# Patient Record
Sex: Female | Born: 1975 | Hispanic: No | Smoking: Never smoker
Health system: Southern US, Community
[De-identification: ages and names within clinical notes are randomized; demographics above are authoritative.]

---

## 2005-10-30 ENCOUNTER — Ambulatory Visit (HOSPITAL_COMMUNITY): Admission: RE | Admit: 2005-10-30 | Discharge: 2005-10-30 | Payer: Self-pay | Admitting: Orthopedic Surgery

## 2005-11-04 ENCOUNTER — Other Ambulatory Visit: Admission: RE | Admit: 2005-11-04 | Discharge: 2005-11-04 | Payer: Self-pay | Admitting: *Deleted

## 2006-11-16 ENCOUNTER — Other Ambulatory Visit: Admission: RE | Admit: 2006-11-16 | Discharge: 2006-11-16 | Payer: Self-pay | Admitting: Family Medicine

## 2007-10-31 ENCOUNTER — Other Ambulatory Visit: Admission: RE | Admit: 2007-10-31 | Discharge: 2007-10-31 | Payer: Self-pay | Admitting: Family Medicine

## 2008-11-13 ENCOUNTER — Other Ambulatory Visit: Admission: RE | Admit: 2008-11-13 | Discharge: 2008-11-13 | Payer: Self-pay | Admitting: Family Medicine

## 2009-11-14 ENCOUNTER — Other Ambulatory Visit: Admission: RE | Admit: 2009-11-14 | Discharge: 2009-11-14 | Payer: Self-pay | Admitting: Family Medicine

## 2009-12-09 ENCOUNTER — Ambulatory Visit: Payer: Self-pay | Admitting: Gynecology

## 2010-12-03 ENCOUNTER — Other Ambulatory Visit (HOSPITAL_COMMUNITY)
Admission: RE | Admit: 2010-12-03 | Discharge: 2010-12-03 | Disposition: A | Discharge status: Home / Self Care | Payer: Managed Care, Other (non HMO) | Source: Ambulatory Visit | Attending: Family Medicine | Admitting: Family Medicine

## 2010-12-03 ENCOUNTER — Other Ambulatory Visit: Payer: Self-pay | Admitting: Family Medicine

## 2010-12-03 DIAGNOSIS — Z1159 Encounter for screening for other viral diseases: Secondary | ICD-10-CM | POA: Insufficient documentation

## 2010-12-03 DIAGNOSIS — Z124 Encounter for screening for malignant neoplasm of cervix: Secondary | ICD-10-CM | POA: Insufficient documentation

## 2011-10-22 ENCOUNTER — Other Ambulatory Visit: Payer: Self-pay | Admitting: Family Medicine

## 2011-10-22 ENCOUNTER — Other Ambulatory Visit (HOSPITAL_COMMUNITY)
Admission: RE | Admit: 2011-10-22 | Discharge: 2011-10-22 | Disposition: A | Discharge status: Home / Self Care | Payer: BC Managed Care – PPO | Source: Ambulatory Visit | Attending: Family Medicine | Admitting: Family Medicine

## 2011-10-22 DIAGNOSIS — Z124 Encounter for screening for malignant neoplasm of cervix: Secondary | ICD-10-CM | POA: Insufficient documentation

## 2011-10-22 DIAGNOSIS — Z1159 Encounter for screening for other viral diseases: Secondary | ICD-10-CM | POA: Insufficient documentation

## 2012-12-08 ENCOUNTER — Other Ambulatory Visit: Payer: Self-pay | Admitting: Physician Assistant

## 2012-12-08 ENCOUNTER — Other Ambulatory Visit (HOSPITAL_COMMUNITY)
Admission: RE | Admit: 2012-12-08 | Discharge: 2012-12-08 | Disposition: A | Discharge status: Home / Self Care | Payer: BC Managed Care – PPO | Source: Ambulatory Visit | Attending: Physician Assistant | Admitting: Physician Assistant

## 2012-12-08 DIAGNOSIS — Z01419 Encounter for gynecological examination (general) (routine) without abnormal findings: Secondary | ICD-10-CM | POA: Insufficient documentation

## 2012-12-08 DIAGNOSIS — Z1151 Encounter for screening for human papillomavirus (HPV): Secondary | ICD-10-CM | POA: Insufficient documentation

## 2015-06-05 ENCOUNTER — Other Ambulatory Visit: Payer: Self-pay | Admitting: Family Medicine

## 2015-06-05 ENCOUNTER — Other Ambulatory Visit (HOSPITAL_COMMUNITY)
Admission: RE | Admit: 2015-06-05 | Discharge: 2015-06-05 | Disposition: A | Discharge status: Home / Self Care | Payer: BLUE CROSS/BLUE SHIELD | Source: Ambulatory Visit | Attending: Family Medicine | Admitting: Family Medicine

## 2015-06-05 DIAGNOSIS — Z124 Encounter for screening for malignant neoplasm of cervix: Secondary | ICD-10-CM | POA: Diagnosis present

## 2015-06-05 DIAGNOSIS — Z1151 Encounter for screening for human papillomavirus (HPV): Secondary | ICD-10-CM | POA: Insufficient documentation

## 2015-06-06 LAB — CYTOLOGY - PAP

## 2016-02-12 ENCOUNTER — Encounter: Payer: Self-pay | Admitting: Podiatry

## 2016-02-12 ENCOUNTER — Ambulatory Visit (INDEPENDENT_AMBULATORY_CARE_PROVIDER_SITE_OTHER): Payer: BLUE CROSS/BLUE SHIELD

## 2016-02-12 ENCOUNTER — Ambulatory Visit (INDEPENDENT_AMBULATORY_CARE_PROVIDER_SITE_OTHER): Payer: BLUE CROSS/BLUE SHIELD | Admitting: Podiatry

## 2016-02-12 VITALS — BP 122/75 | HR 78 | Resp 16 | Ht 61.0 in | Wt 111.0 lb

## 2016-02-12 DIAGNOSIS — M79671 Pain in right foot: Secondary | ICD-10-CM

## 2016-02-12 DIAGNOSIS — M722 Plantar fascial fibromatosis: Secondary | ICD-10-CM | POA: Diagnosis not present

## 2016-02-12 MED ORDER — TRIAMCINOLONE ACETONIDE 10 MG/ML IJ SUSP
10.0000 mg | Freq: Once | INTRAMUSCULAR | Status: AC
  Administered 2016-02-12: 10 mg

## 2016-02-12 NOTE — Progress Notes (Signed)
   Subjective:    Patient ID: Sarah Ross, female    DOB: 04-Feb-1976, 40 y.o.   MRN: [id number]  HPI  Chief Complaint  Patient presents with  . Foot Pain    Right foot; arch and back of heel x 4-6 mo.         Review of Systems  All other systems reviewed and are negative.      Objective:   Physical Exam        Assessment & Plan:

## 2016-02-12 NOTE — Patient Instructions (Signed)

## 2016-02-13 NOTE — Progress Notes (Signed)
Subjective:     Patient ID: Sarah Ross Ismael, female   DOB: 1975/09/25, 40 y.o.   MRN: [id number]  HPI patient presents stating she's having a lot of pain in the bottom of her right heel of several months duration. Does not remember specific injury but she is very active and does a lot of exercising also   Review of Systems  All other systems reviewed and are negative.      Objective:   Physical Exam  Constitutional: She is oriented to person, place, and time.  Cardiovascular: Intact distal pulses.   Musculoskeletal: Normal range of motion.  Neurological: She is oriented to person, place, and time.  Skin: Skin is warm and dry.  Nursing note and vitals reviewed.  neurovascular status intact muscle strength adequate range of motion within normal limits with patient found to have exquisite discomfort plantar aspect right heel at the insertional point tendon the calcaneus with fluid buildup around the medial band. Moderate depression of the arch and a very small foot is noted     Assessment:     Inflammatory fasciitis plantar heel with small foot structure and narrow foot structure    Plan:     H&P x-rays reviewed and carefully injected the plantar fascia 3 mg Kenalog 5 mill grams Xylocaine and applied fascial brace. Instructed on reduced activity and reappoint to recheck  X-ray report indicates that there is small spur with no indication stress fracture arthritis

## 2016-02-26 ENCOUNTER — Encounter: Payer: Self-pay | Admitting: Podiatry

## 2016-02-26 ENCOUNTER — Ambulatory Visit (INDEPENDENT_AMBULATORY_CARE_PROVIDER_SITE_OTHER): Payer: BLUE CROSS/BLUE SHIELD | Admitting: Podiatry

## 2016-02-26 DIAGNOSIS — M722 Plantar fascial fibromatosis: Secondary | ICD-10-CM

## 2016-02-27 NOTE — Progress Notes (Signed)
Subjective:     Patient ID: Sarah Ross, female   DOB: 11-08-1975, 40 y.o.   MRN: [id number]  HPI patient presents stating that her heel is feeling some better and she needs more support and the brace has helped some   Review of Systems     Objective:   Physical Exam Neurovascular status unchanged with depression of the arch and moderate discomfort plantar fascia right and left    Assessment:     Plantar fasciitis    Plan:     Advised on physical therapy anti-inflammatories and scanned for custom orthotics to reduce plantar pressure on the feet. Also dispensed night splint with all instructions on usage

## 2016-03-18 ENCOUNTER — Ambulatory Visit (INDEPENDENT_AMBULATORY_CARE_PROVIDER_SITE_OTHER): Payer: BLUE CROSS/BLUE SHIELD | Admitting: Podiatry

## 2016-03-18 ENCOUNTER — Encounter: Payer: Self-pay | Admitting: Podiatry

## 2016-03-18 DIAGNOSIS — M722 Plantar fascial fibromatosis: Secondary | ICD-10-CM | POA: Diagnosis not present

## 2016-03-18 NOTE — Patient Instructions (Signed)

## 2016-03-19 NOTE — Progress Notes (Signed)
Subjective:     Patient ID: Sarah Ross, female   DOB: [birth year], 40 y.o.   MRN: [id number]  HPI patient states my foot feeling much better with mild discomfort with exertion   Review of Systems     Objective:   Physical Exam Neurovascular status intact with discomfort that's diminished on the plantar right    Assessment:     Doing well with plantar fasciitis    Plan:     Orthotics dispensed with instructions on usage and continue anti-inflammatories supportive shoes the current time

## 2016-06-19 ENCOUNTER — Ambulatory Visit: Payer: BLUE CROSS/BLUE SHIELD | Admitting: Podiatry

## 2016-07-01 ENCOUNTER — Ambulatory Visit (INDEPENDENT_AMBULATORY_CARE_PROVIDER_SITE_OTHER): Payer: BLUE CROSS/BLUE SHIELD | Admitting: Podiatry

## 2016-07-01 DIAGNOSIS — M722 Plantar fascial fibromatosis: Secondary | ICD-10-CM

## 2016-07-01 DIAGNOSIS — M79671 Pain in right foot: Secondary | ICD-10-CM

## 2016-07-01 MED ORDER — NONFORMULARY OR COMPOUNDED ITEM: 1.0000 g | Freq: Four times a day (QID) | 2 refills | Status: AC

## 2016-07-05 NOTE — Progress Notes (Signed)
Subjective: Patient presents today for evaluation of right heel pain. Patient was last seen 03/18/2016 under the care of Dr. Charlsie Merlesregal 4 plantar fasciitis to the right lower extremity. Patient states that it has flared up again. Patient denies trauma or any change in routine activity.  Objective: Physical Exam General: The patient is alert and oriented x3 in no acute distress.  Dermatology: Skin is warm, dry and supple bilateral lower extremities. Negative for open lesions or macerations bilateral.   Vascular: Dorsalis Pedis and Posterior Tibial pulses palpable bilateral.  Capillary fill time is immediate to all digits.  Neurological: Epicritic and protective threshold intact bilateral.   Musculoskeletal: Tenderness to palpation at the medial calcaneal tubercale and through the insertion of the plantar fascia of the right foot. All other joints range of motion within normal limits bilateral. Strength 5/5 in all groups bilateral.   Assessment: 1. Plantar fasciitis right 2. Pain in right foot  Plan of Care:  1. Patient evaluated.   2. Plantar fascial band dispensed. 3. Informed the patient that she needs to continue with conservative modalities including stretching 4. Prescription for anti-inflammatory pain cream dispensed [facility] 5. Recommend Vionic shoes and sandals 6. Return to clinic in 4 weeks 7. Patient did not want an anti-inflammatory injection today  Felecia ShellingBrent M. Evans, DPM [facility]  Dr. Felecia ShellingBrent M. Evans, DPM    [address]                                        [place], [place] [address]                Office [phone]  Fax [phone](336) [phone]

## 2016-07-29 ENCOUNTER — Ambulatory Visit: Payer: BLUE CROSS/BLUE SHIELD | Admitting: Podiatry

## 2016-08-03 ENCOUNTER — Ambulatory Visit (INDEPENDENT_AMBULATORY_CARE_PROVIDER_SITE_OTHER): Payer: BLUE CROSS/BLUE SHIELD | Admitting: Podiatry

## 2016-08-03 DIAGNOSIS — M79671 Pain in right foot: Secondary | ICD-10-CM | POA: Diagnosis not present

## 2016-08-03 DIAGNOSIS — M722 Plantar fascial fibromatosis: Secondary | ICD-10-CM | POA: Diagnosis not present

## 2016-08-03 NOTE — Progress Notes (Signed)
Subjective: Patient presents today for follow-up treatment and evaluation of plantar fasciitis to the right foot. Patient continues to feel pain however she does feel a little better. Patient presents today with her custom molded insoles and over-the-counter insoles from [facility].   Objective: Physical Exam General: The patient is alert and oriented x3 in no acute distress.  Dermatology: Skin is warm, dry and supple bilateral lower extremities. Negative for open lesions or macerations bilateral.   Vascular: Dorsalis Pedis and Posterior Tibial pulses palpable bilateral.  Capillary fill time is immediate to all digits.  Neurological: Epicritic and protective threshold intact bilateral.   Musculoskeletal: Tenderness to palpation at the medial calcaneal tubercale and through the insertion of the plantar fascia of the right foot. All other joints range of motion within normal limits bilateral. Strength 5/5 in all groups bilateral.   Assessment: 1. Plantar fasciitis right 2. Pain in right foot  Plan of Care:  1. Patient evaluated.   2. Recommend the patient wears her custom molded insoles instead of the over-the-counter insoles, as the custom molded insoles contour her foot structure better 3. Injection of 0.5 mL Celestone Soluspan injected in the patient's right plantar fascia at the medial calcaneal tubercle 4. Continue conservative modalities including stretching, icing, appropriate shoe gear with insoles 5. Return to clinic in 4 weeks  Felecia ShellingBrent M. Breylin Dom, DPM [facility]  Dr. Felecia ShellingBrent M. Braylon Lemmons, DPM    [address]                                        [place], [place] [address]                Office [phone]) [phone]  Fax [phone](336) [phone]

## 2016-08-31 ENCOUNTER — Ambulatory Visit: Payer: BLUE CROSS/BLUE SHIELD | Admitting: Podiatry

## 2016-09-14 ENCOUNTER — Ambulatory Visit (INDEPENDENT_AMBULATORY_CARE_PROVIDER_SITE_OTHER): Payer: BLUE CROSS/BLUE SHIELD | Admitting: Podiatry

## 2016-09-14 ENCOUNTER — Encounter: Payer: Self-pay | Admitting: Podiatry

## 2016-09-14 DIAGNOSIS — M79671 Pain in right foot: Secondary | ICD-10-CM | POA: Diagnosis not present

## 2016-09-14 DIAGNOSIS — M722 Plantar fascial fibromatosis: Secondary | ICD-10-CM | POA: Diagnosis not present

## 2016-09-14 MED ORDER — BETAMETHASONE SOD PHOS & ACET 6 (3-3) MG/ML IJ SUSP: 3.0000 mg | Freq: Once | INTRAMUSCULAR | Status: AC

## 2016-09-14 NOTE — Progress Notes (Signed)
   Subjective: Patient presents today for follow-up treatment and evaluation of plantar fasciitis to the right foot. Patient states that she's doing much better. She states that the injections helped.  Objective: Physical Exam General: The patient is alert and oriented x3 in no acute distress.  Dermatology: Skin is warm, dry and supple bilateral lower extremities. Negative for open lesions or macerations bilateral.   Vascular: Dorsalis Pedis and Posterior Tibial pulses palpable bilateral.  Capillary fill time is immediate to all digits.  Neurological: Epicritic and protective threshold intact bilateral.   Musculoskeletal: Tenderness to palpation at the medial calcaneal tubercale and through the insertion of the plantar fascia of the right foot. All other joints range of motion within normal limits bilateral. Strength 5/5 in all groups bilateral.   Assessment: 1. Plantar fasciitis right 2. Pain in right foot  Plan of Care:  1. Patient evaluated. Xrays reviewed.   2. Injection of 0.5cc Celestone soluspan injected into the right heel at the insertion of the plantar fascia.  3. Continue daily stretching exercises. 4. Continue custom molded orthotics. 5. Return to clinic in 4 weeks   Felecia ShellingBrent M. Evans, DPM [facility]  Dr. Felecia ShellingBrent M. Evans, DPM    [address]                                        [place], [place] [address]                Office [phone](336) [phone]  Fax [phone](336) [phone]

## 2016-10-12 ENCOUNTER — Ambulatory Visit: Payer: BLUE CROSS/BLUE SHIELD | Admitting: Podiatry

## 2018-02-02 ENCOUNTER — Ambulatory Visit: Payer: Managed Care, Other (non HMO) | Admitting: Podiatry

## 2018-02-02 ENCOUNTER — Encounter: Payer: Self-pay | Admitting: Podiatry

## 2018-02-02 ENCOUNTER — Ambulatory Visit (INDEPENDENT_AMBULATORY_CARE_PROVIDER_SITE_OTHER): Payer: Managed Care, Other (non HMO)

## 2018-02-02 ENCOUNTER — Other Ambulatory Visit: Payer: Self-pay | Admitting: Podiatry

## 2018-02-02 DIAGNOSIS — M722 Plantar fascial fibromatosis: Secondary | ICD-10-CM | POA: Diagnosis not present

## 2018-02-02 DIAGNOSIS — M79672 Pain in left foot: Secondary | ICD-10-CM

## 2018-02-04 NOTE — Progress Notes (Signed)
   Subjective: 42 year old female presenting today for intermittent pain to the plantar aspect of the left heel that began 2-3 months ago. Walking and standing for long periods of time increases the pain. She has not done anything for pain but has been wearing custom molded orthotics. She believes the pain is the same as the right foot. Patient is here for further evaluation and treatment.    History reviewed. No pertinent past medical history.   Objective: Physical Exam General: The patient is alert and oriented x3 in no acute distress.  Dermatology: Skin is warm, dry and supple bilateral lower extremities. Negative for open lesions or macerations bilateral.   Vascular: Dorsalis Pedis and Posterior Tibial pulses palpable bilateral.  Capillary fill time is immediate to all digits.  Neurological: Epicritic and protective threshold intact bilateral.   Musculoskeletal: Tenderness to palpation to the plantar aspect of the left heel along the plantar fascia. All other joints range of motion within normal limits bilateral. Strength 5/5 in all groups bilateral.   Radiographic exam:   Normal osseous mineralization. Joint spaces preserved. No fracture/dislocation/boney destruction. No other soft tissue abnormalities or radiopaque foreign bodies.   Assessment: 1. Plantar fasciitis left foot  Plan of Care:  1. Patient evaluated. Xrays reviewed.   2. Injection of 0.5cc Celestone soluspan injected into the left plantar fascia.  3. Declines oral medication and plantar fascial brace.  4. Continue wearing custom molded orthotics and good shoe gear.  5. Return to clinic in 4 weeks.   Dietician for multiple family practices.     Felecia ShellingBrent M. Evans, DPM [facility]  Dr. Felecia ShellingBrent M. Evans, DPM    [address]                                     [place], [place] [address]                Office [phone](336) [phone]  Fax [phone](336) [phone]

## 2018-03-02 ENCOUNTER — Ambulatory Visit (INDEPENDENT_AMBULATORY_CARE_PROVIDER_SITE_OTHER): Payer: Managed Care, Other (non HMO) | Admitting: Podiatry

## 2018-03-02 ENCOUNTER — Encounter: Payer: Self-pay | Admitting: Podiatry

## 2018-03-02 DIAGNOSIS — M722 Plantar fascial fibromatosis: Secondary | ICD-10-CM | POA: Diagnosis not present

## 2018-03-09 ENCOUNTER — Telehealth: Payer: Self-pay | Admitting: Podiatry

## 2018-03-09 NOTE — Telephone Encounter (Signed)
Pt returned call and left me a message asking for cost if not covered.  I called pt back and told her the cost is 398.00 and we could get her scheduled to see Raiford NobleRick if she wanted to proceed.  Pt decided to wait until her appt with Dr Logan BoresEvans to discuss and possible proceed with the orthotics at that time. I told pt if she changed her mind to give me a call back but I would include this information in the office visit note for 10.9.19

## 2018-03-09 NOTE — Telephone Encounter (Signed)
Left message for pt that orthotics are not covered under pts policy unless diabetic.

## 2018-03-09 NOTE — Progress Notes (Signed)
   Subjective: 42 year old female presenting today for follow up evaluation of plantar fasciitis of the left foot. She states the pain has improved since her last visit. She states the injection has provided some relief of the pain. Walking for long periods of time increases the pain. Patient is here for further evaluation and treatment.    History reviewed. No pertinent past medical history.   Objective: Physical Exam General: The patient is alert and oriented x3 in no acute distress.  Dermatology: Skin is warm, dry and supple bilateral lower extremities. Negative for open lesions or macerations bilateral.   Vascular: Dorsalis Pedis and Posterior Tibial pulses palpable bilateral.  Capillary fill time is immediate to all digits.  Neurological: Epicritic and protective threshold intact bilateral.   Musculoskeletal: Tenderness to palpation to the plantar aspect of the left heel along the plantar fascia. All other joints range of motion within normal limits bilateral. Strength 5/5 in all groups bilateral.   Assessment: 1. Plantar fasciitis left foot - improved   Plan of Care:  1. Patient evaluated.    2. Injection of 0.5cc Celestone soluspan injected into the left plantar fascia.  3. Declines oral medication and plantar fascial brace.  4. Authorization request for custom molded orthotics placed.  5. Return to clinic in 4 weeks.   Dietician for multiple family practices.     Felecia ShellingBrent M. Merrick Maggio, DPM [facility]  Dr. Felecia ShellingBrent M. Mylani Gentry, DPM    [address]                                     [place], [place] [address]                Office [phone](336) [phone]  Fax [phone](336) [phone]

## 2018-03-30 ENCOUNTER — Ambulatory Visit (INDEPENDENT_AMBULATORY_CARE_PROVIDER_SITE_OTHER): Payer: Managed Care, Other (non HMO) | Admitting: Orthotics

## 2018-03-30 ENCOUNTER — Ambulatory Visit: Payer: Managed Care, Other (non HMO) | Admitting: Podiatry

## 2018-03-30 DIAGNOSIS — M722 Plantar fascial fibromatosis: Secondary | ICD-10-CM | POA: Diagnosis not present

## 2018-03-30 NOTE — Progress Notes (Signed)

## 2018-04-05 NOTE — Progress Notes (Signed)
   Subjective: 42 year old female presenting today for follow up evaluation of plantar fasciitis of the left foot. She reports pain to the bilateral feet and states it is worsening. She states it is now located in the posterior heel. She has not gotten orthotics because they are no covered by her insurance. Patient is here for further evaluation and treatment.    No past medical history on file.   Objective: Physical Exam General: The patient is alert and oriented x3 in no acute distress.  Dermatology: Skin is warm, dry and supple bilateral lower extremities. Negative for open lesions or macerations bilateral.   Vascular: Dorsalis Pedis and Posterior Tibial pulses palpable bilateral.  Capillary fill time is immediate to all digits.  Neurological: Epicritic and protective threshold intact bilateral.   Musculoskeletal: Tenderness to palpation to the plantar aspect of the left heel along the plantar fascia. All other joints range of motion within normal limits bilateral. Strength 5/5 in all groups bilateral.   Assessment: 1. Plantar fasciitis left foot   Plan of Care:  1. Patient evaluated.    2. Injection of 0.5cc Celestone soluspan injected into the left plantar fascia.  3. Scanned for custom molded orthotics today.  4. Return to clinic as needed.   Dietician for multiple family practices.     Felecia Shelling, DPM [facility]  Dr. Felecia Shelling, DPM    [address] [place], [place] [address]                Office [phone]  Fax [phone]

## 2018-04-20 ENCOUNTER — Ambulatory Visit (INDEPENDENT_AMBULATORY_CARE_PROVIDER_SITE_OTHER): Payer: Managed Care, Other (non HMO) | Admitting: Orthotics

## 2018-04-20 DIAGNOSIS — M722 Plantar fascial fibromatosis: Secondary | ICD-10-CM

## 2018-04-20 DIAGNOSIS — M79671 Pain in right foot: Secondary | ICD-10-CM

## 2018-04-20 NOTE — Progress Notes (Signed)
Patient came in today to pick up custom made foot orthotics.  The goals were accomplished and the patient reported no dissatisfaction with said orthotics.  Patient was advised of breakin period and how to report any issues.   Patient also paid 90 for refurb current f/o

## 2018-07-06 ENCOUNTER — Encounter: Payer: Self-pay | Admitting: Podiatry

## 2018-07-06 ENCOUNTER — Ambulatory Visit: Payer: 59 | Admitting: Podiatry

## 2018-07-06 ENCOUNTER — Ambulatory Visit (INDEPENDENT_AMBULATORY_CARE_PROVIDER_SITE_OTHER): Payer: 59

## 2018-07-06 DIAGNOSIS — M722 Plantar fascial fibromatosis: Secondary | ICD-10-CM

## 2018-07-10 NOTE — Progress Notes (Signed)
   Subjective: 43 year old female presenting today for follow up evaluation of plantar fasciitis of the left foot. She states the pain is now more intense and more frequent. She states it is now located in the middle of the plantar heel. She has been using the custom orthotics as directed. Walking for long periods of time increases the pain. Patient is here for further evaluation and treatment.    History reviewed. No pertinent past medical history.   Objective: Physical Exam General: The patient is alert and oriented x3 in no acute distress.  Dermatology: Skin is warm, dry and supple bilateral lower extremities. Negative for open lesions or macerations bilateral.   Vascular: Dorsalis Pedis and Posterior Tibial pulses palpable bilateral.  Capillary fill time is immediate to all digits.  Neurological: Epicritic and protective threshold intact bilateral.   Musculoskeletal: Tenderness to palpation to the plantar aspect of the left heel along the plantar fascia. All other joints range of motion within normal limits bilateral. Strength 5/5 in all groups bilateral.   Radiographic Exam:  Normal osseous mineralization. Joint spaces preserved. No fracture/dislocation/boney destruction.    Assessment: 1. Plantar fasciitis left foot   Plan of Care:  1. Patient evaluated. X-Rays reviewed.  2. Injection of 0.5cc Celestone soluspan injected into the left plantar fascia.  3. Declined oral medication.  4. Continue using custom orthotics.  5. Return to clinic in 6 weeks. If not better, we will move forward with surgery.   Dietician for multiple family practices.     Felecia Shelling, DPM [facility]  Dr. Felecia Shelling, DPM    [address] [place], [place] [address]                Office [phone]  Fax [phone]

## 2019-01-13 ENCOUNTER — Other Ambulatory Visit: Payer: Self-pay

## 2019-01-13 ENCOUNTER — Emergency Department (HOSPITAL_COMMUNITY)
Admission: EM | Admit: 2019-01-13 | Discharge: 2019-01-13 | Disposition: A | Discharge status: Home / Self Care | Payer: 59 | Attending: Emergency Medicine | Admitting: Emergency Medicine

## 2019-01-13 DIAGNOSIS — Z79899 Other long term (current) drug therapy: Secondary | ICD-10-CM | POA: Insufficient documentation

## 2019-01-13 DIAGNOSIS — R531 Weakness: Secondary | ICD-10-CM | POA: Diagnosis present

## 2019-01-13 DIAGNOSIS — E876 Hypokalemia: Secondary | ICD-10-CM | POA: Diagnosis not present

## 2019-01-13 LAB — COMPREHENSIVE METABOLIC PANEL
ALT: 15 U/L (ref 0–44)
AST: 18 U/L (ref 15–41)
Albumin: 4.1 g/dL (ref 3.5–5.0)
Alkaline Phosphatase: 39 U/L (ref 38–126)
Anion gap: 14 (ref 5–15)
BUN: <5 mg/dL — ABNORMAL LOW (ref 6–20)
CO2: 22 mmol/L (ref 22–32)
Calcium: 9.2 mg/dL (ref 8.9–10.3)
Chloride: 99 mmol/L (ref 98–111)
Creatinine, Ser: 0.59 mg/dL (ref 0.44–1.00)
GFR calc Af Amer: >60 mL/min (ref >60)
GFR calc non Af Amer: >60 mL/min (ref >60)
Glucose, Bld: 131 mg/dL — ABNORMAL HIGH (ref 70–99)
Potassium: 3.2 mmol/L — ABNORMAL LOW (ref 3.5–5.1)
Sodium: 135 mmol/L (ref 135–145)
Total Bilirubin: 0.8 mg/dL (ref 0.3–1.2)
Total Protein: 7 g/dL (ref 6.5–8.1)

## 2019-01-13 LAB — CBC WITH DIFFERENTIAL/PLATELET
Abs Immature Granulocytes: 0.02 10*3/uL (ref 0.00–0.07)
Basophils Absolute: 0.1 10*3/uL (ref 0.0–0.1)
Basophils Relative: 1 %
Eosinophils Absolute: 0.1 10*3/uL (ref 0.0–0.5)
Eosinophils Relative: 1 %
HCT: 39.4 % (ref 36.0–46.0)
Hemoglobin: 12.9 g/dL (ref 12.0–15.0)
Immature Granulocytes: 0 %
Lymphocytes Relative: 29 %
Lymphs Abs: 2.2 10*3/uL (ref 0.7–4.0)
MCH: 29 pg (ref 26.0–34.0)
MCHC: 32.7 g/dL (ref 30.0–36.0)
MCV: 88.5 fL (ref 80.0–100.0)
Monocytes Absolute: 0.5 10*3/uL (ref 0.1–1.0)
Monocytes Relative: 7 %
Neutro Abs: 4.7 10*3/uL (ref 1.7–7.7)
Neutrophils Relative %: 62 %
Platelets: 261 10*3/uL (ref 150–400)
RBC: 4.45 MIL/uL (ref 3.87–5.11)
RDW: 12.8 % (ref 11.5–15.5)
WBC: 7.6 10*3/uL (ref 4.0–10.5)
nRBC: 0 % (ref 0.0–0.2)

## 2019-01-13 LAB — URINALYSIS, ROUTINE W REFLEX MICROSCOPIC
Bilirubin Urine: NEGATIVE
Glucose, UA: NEGATIVE mg/dL
Hgb urine dipstick: NEGATIVE
Ketones, ur: NEGATIVE mg/dL
Leukocytes,Ua: NEGATIVE
Nitrite: NEGATIVE
Protein, ur: 30 mg/dL — AB
Specific Gravity, Urine: 1.004 — ABNORMAL LOW (ref 1.005–1.030)
pH: 7 (ref 5.0–8.0)

## 2019-01-13 LAB — CBG MONITORING, ED: Glucose-Capillary: 134 mg/dL — ABNORMAL HIGH (ref 70–99)

## 2019-01-13 LAB — POC URINE PREG, ED: Preg Test, Ur: NEGATIVE

## 2019-01-13 MED ORDER — POTASSIUM CHLORIDE ER 10 MEQ PO TBCR: 20.0000 meq | EXTENDED_RELEASE_TABLET | Freq: Every day | ORAL | 0 refills | Status: AC

## 2019-01-13 MED ORDER — SODIUM CHLORIDE 0.9 % IV BOLUS
1000.0000 mL | Freq: Once | INTRAVENOUS | Status: AC
  Administered 2019-01-13: 1000 mL via INTRAVENOUS

## 2019-01-13 MED ORDER — POTASSIUM CHLORIDE CRYS ER 20 MEQ PO TBCR
40.0000 meq | EXTENDED_RELEASE_TABLET | Freq: Once | ORAL | Status: AC
  Administered 2019-01-13: 40 meq via ORAL
  Filled 2019-01-13: qty 2

## 2019-01-13 NOTE — ED Notes (Signed)
Pt able to stand for orthostatic VS although she sways briefly.  Appears to need (A) to sit, but able to hold position.  Arms flaccid while starting IV, but she is able to hold phone to speak to registration, then quickly drops her arm back in place at side.

## 2019-01-13 NOTE — ED Triage Notes (Signed)
Sudden onset dizziness while sitting at work with weakness.   Denies any pain or feeling sick prior to going to work.  Has been feeling fine.  On arrival pt appears lethargic and will not (A) with undressing.

## 2019-01-13 NOTE — Discharge Instructions (Addendum)
Please have your potassium level rechecked at your PCPs office in 1 week. Return to the ED if you start to have worsening symptoms, head injuries or falls, numbness in arms or legs or vision changes.

## 2019-01-13 NOTE — ED Notes (Signed)
Pt able to walk to BR without gross need for (A).  More alert and appropriate, states she feels much better.

## 2019-01-13 NOTE — ED Provider Notes (Signed)
[facility] Provider Note   CSN: [id number] Arrival date & time: 01/13/19  1356     History   Chief Complaint No chief complaint on file.   HPI Sarah Ross is a 43 y.o. female.     Patient presents to the emergency department by EMS with complaint of generalized weakness.  Patient states that she felt normal this morning when she woke up.  She was sitting at work, not doing anything strenuous, when she suddenly became "dizzy".  She describes this as a lightheadedness.  She did not pass out.  She states that her arms and legs and head feel heavy.  She denies any headache.  No vision changes, nausea, vomiting, or diarrhea.  Denies decreased oral intake.  No chest pain or shortness of breath.  No recent fevers or sick contacts.  She denies any cough.  Denies any urinary symptoms.  Onset of symptoms acute.  Course is constant.  Nothing makes symptoms better or worse.     No past medical history on file.  There are no active problems to display for this patient.   No past surgical history on file.   OB History   No obstetric history on file.      Home Medications    Prior to Admission medications   Medication Sig Start Date End Date Taking? Authorizing Provider  NONFORMULARY OR COMPOUNDED ITEM Apply 1-2 g topically 4 (four) times daily. 07/01/16   Felecia ShellingEvans, Brent M, DPM  TRI-PREVIFEM 0.18/0.215/0.25 MG-35 MCG tablet Take 1 tablet by mouth daily. 11/17/17   [provider]    Family History No family history on file.  Social History Social History   Tobacco Use  . Smoking status: Never Smoker  . Smokeless tobacco: Never Used  Substance Use Topics  . Alcohol use: Not on file  . Drug use: Not on file     Allergies   Patient has no known allergies.   Review of Systems Review of Systems  Constitutional: Negative for fever.  HENT: Negative for rhinorrhea and sore throat.   Eyes: Negative for redness.  Respiratory:  Negative for cough.   Cardiovascular: Negative for chest pain.  Gastrointestinal: Negative for abdominal pain, diarrhea, nausea and vomiting.  Genitourinary: Negative for dysuria.  Musculoskeletal: Negative for myalgias.  Skin: Negative for rash.  Neurological: Positive for dizziness and weakness. Negative for syncope, facial asymmetry, speech difficulty, numbness and headaches.     Physical Exam Updated Vital Signs BP 126/89 (BP Location: Right Arm)   Pulse 66   Temp (!) 97.4 F (36.3 C) (Oral)   Resp 19   LMP 11/01/2018 (Approximate) Comment: States periods ar irregular  SpO2 100%   Physical Exam Vitals signs and nursing note reviewed.  Constitutional:      Appearance: She is well-developed.  HENT:     Head: Normocephalic and atraumatic.     Right Ear: Tympanic membrane, ear canal and external ear normal.     Left Ear: Tympanic membrane, ear canal and external ear normal.     Nose: Nose normal.     Mouth/Throat:     Pharynx: Uvula midline.  Eyes:     General: Lids are normal.        Right eye: No discharge.        Left eye: No discharge.     Extraocular Movements:     Right eye: No nystagmus.     Left eye: No nystagmus.     Conjunctiva/sclera:  Conjunctivae normal.     Pupils: Pupils are equal, round, and reactive to light.  Neck:     Musculoskeletal: Normal range of motion and neck supple.  Cardiovascular:     Rate and Rhythm: Normal rate and regular rhythm.     Heart sounds: Normal heart sounds.  Pulmonary:     Effort: Pulmonary effort is normal.     Breath sounds: Normal breath sounds.  Abdominal:     Palpations: Abdomen is soft.     Tenderness: There is no abdominal tenderness.  Musculoskeletal:        General: Tenderness present.     Cervical back: She exhibits normal range of motion, no tenderness and no bony tenderness.  Skin:    General: Skin is warm and dry.  Neurological:     Mental Status: She is alert and oriented to person, place, and time.      GCS: GCS eye subscore is 4. GCS verbal subscore is 5. GCS motor subscore is 6.     Cranial Nerves: No cranial nerve deficit.     Sensory: No sensory deficit.     Coordination: Coordination normal.     Gait: Gait normal.     Deep Tendon Reflexes: Reflexes are normal and symmetric.     Comments: Poor effort with exam.       ED Treatments / Results  Labs (all labs ordered are listed, but only abnormal results are displayed) Labs Reviewed  COMPREHENSIVE METABOLIC PANEL - Abnormal; Notable for the following components:      Result Value   Potassium 3.2 (*)    Glucose, Bld 131 (*)    BUN <5 (*)    All other components within normal limits  CBG MONITORING, ED - Abnormal; Notable for the following components:   Glucose-Capillary 134 (*)    All other components within normal limits  CBC WITH DIFFERENTIAL/PLATELET  URINALYSIS, ROUTINE W REFLEX MICROSCOPIC  I-STAT BETA HCG BLOOD, ED ([facility], [facility], [facility] ONLY)    ED ECG REPORT   Date: 01/13/2019  Rate: 66  Rhythm: normal sinus rhythm  QRS Axis: normal  Intervals: normal  ST/T Wave abnormalities: normal  Conduction Disutrbances:none  Narrative Interpretation:   Old EKG Reviewed: none available  I have personally reviewed the EKG tracing and agree with the computerized printout as noted.  Radiology No results found.  Procedures Procedures (including critical care time)  Medications Ordered in ED Medications  potassium chloride SA (K-DUR) CR tablet 40 mEq (has no administration in time range)  sodium chloride 0.9 % bolus 1,000 mL (1,000 mLs Intravenous New Bag/Given 01/13/19 1436)     Initial Impression / Assessment and Plan / ED Course  I have reviewed the triage vital signs and the nursing notes.  Pertinent labs & imaging results that were available during my care of the patient were reviewed by me and considered in my medical decision making (see chart for details).         Patient seen and examined. Unclear etiology of  symptoms. No focal neuro deficits. She does not seem very willing to participate with exam.   Vital signs reviewed and are as follows: BP 126/89 (BP Location: Right Arm)   Pulse 66   Temp (!) 97.4 F (36.3 C) (Oral)   Resp 19   LMP 11/01/2018 (Approximate) Comment: States periods ar irregular  SpO2 100%   3:59 PM Signout to Duke EnergyKhatri PA-C at shift change. Pending completion of work-up and re-eval. Unclear etiology of her symptoms  today.   Final Clinical Impressions(s) / ED Diagnoses   Final diagnoses:  None    Pending work-up.   ED Discharge Orders    None       Carlisle Cater, Hershal Coria 01/13/19 1601    Virgel Manifold, MD 01/14/19 [phone]

## 2019-01-13 NOTE — ED Provider Notes (Signed)
  Physical Exam  BP 130/86   Pulse 73   Temp (!) 97.4 F (36.3 C) (Oral)   Resp 17   LMP 11/01/2018 (Approximate) Comment: States periods ar irregular  SpO2 100%   Physical Exam Vitals signs and nursing note reviewed.  Constitutional:      General: She is not in acute distress.    Appearance: She is well-developed. She is not diaphoretic.  HENT:     Head: Normocephalic and atraumatic.  Eyes:     General: No scleral icterus.    Conjunctiva/sclera: Conjunctivae normal.  Neck:     Musculoskeletal: Normal range of motion.  Pulmonary:     Effort: Pulmonary effort is normal. No respiratory distress.  Skin:    Findings: No rash.  Neurological:     Mental Status: She is alert.     ED Course/Procedures     Procedures  MDM  Care handed off from previous provider PA Geiple.  Please see their note for further detail.  Briefly, patient presents to the ED by EMS with complaints of generalized weakness.  She was in her usual state of health this morning when she woke up.  While sitting at work she suddenly became "dizzy."  Describes lightheadedness and generalized weakness.  Denies any loss of consciousness, headache, vision changes.  CBC is unremarkable.  Plan is to obtain remainder of lab work, give IV fluids and reassess. If work-up is reassuring, can discharge home.  5:48 PM Labs Reviewed  COMPREHENSIVE METABOLIC PANEL - Abnormal; Notable for the following components:      Result Value   Potassium 3.2 (*)    Glucose, Bld 131 (*)    BUN <5 (*)    All other components within normal limits  URINALYSIS, ROUTINE W REFLEX MICROSCOPIC - Abnormal; Notable for the following components:   Color, Urine STRAW (*)    Specific Gravity, Urine 1.004 (*)    Protein, ur 30 (*)    Bacteria, UA RARE (*)    All other components within normal limits  CBG MONITORING, ED - Abnormal; Notable for the following components:   Glucose-Capillary 134 (*)    All other components within normal limits  CBC  WITH DIFFERENTIAL/PLATELET  I-STAT BETA HCG BLOOD, ED ([facility], [facility], [facility] ONLY)  POC URINE PREG, ED   Potassium of 3.2, repleted orally.  EKG without any changes.  Patient ambulated without difficulty, states that she feels better with IV fluids.  We will have her follow-up with PCP regarding potassium recheck.   Patient is hemodynamically stable, in NAD, and able to ambulate in the ED. Evaluation does not show pathology that would require ongoing emergent intervention or inpatient treatment. I explained the diagnosis to the patient. Pain has been managed and has no complaints prior to discharge. Patient is comfortable with above plan and is stable for discharge at this time. All questions were answered prior to disposition. Strict return precautions for returning to the ED were discussed. Encouraged follow up with PCP.   An After Visit Summary was printed and given to the patient.   Portions of this note were generated with [employer]. Dictation errors may occur despite best attempts at proofreading.    Delia Heady, PA-C 01/13/19 1748    Elnora Morrison, MD 01/14/19 1414

## 2019-08-31 ENCOUNTER — Other Ambulatory Visit: Payer: Self-pay | Admitting: Family Medicine

## 2019-08-31 ENCOUNTER — Ambulatory Visit
Admission: RE | Admit: 2019-08-31 | Discharge: 2019-08-31 | Disposition: A | Discharge status: Home / Self Care | Payer: 59 | Source: Ambulatory Visit | Attending: Pediatrics | Admitting: Pediatrics

## 2019-08-31 ENCOUNTER — Other Ambulatory Visit: Payer: Self-pay | Admitting: Pediatrics

## 2019-08-31 DIAGNOSIS — A159 Respiratory tuberculosis unspecified: Secondary | ICD-10-CM

## 2022-01-07 IMAGING — CR DG CHEST 2V
2 series · 2 of 2 positions shown · non-contrast
Comparison: 10/30/2005

CLINICAL DATA: Positive PPD

EXAM:
CHEST - 2 VIEW

[w chest pa]
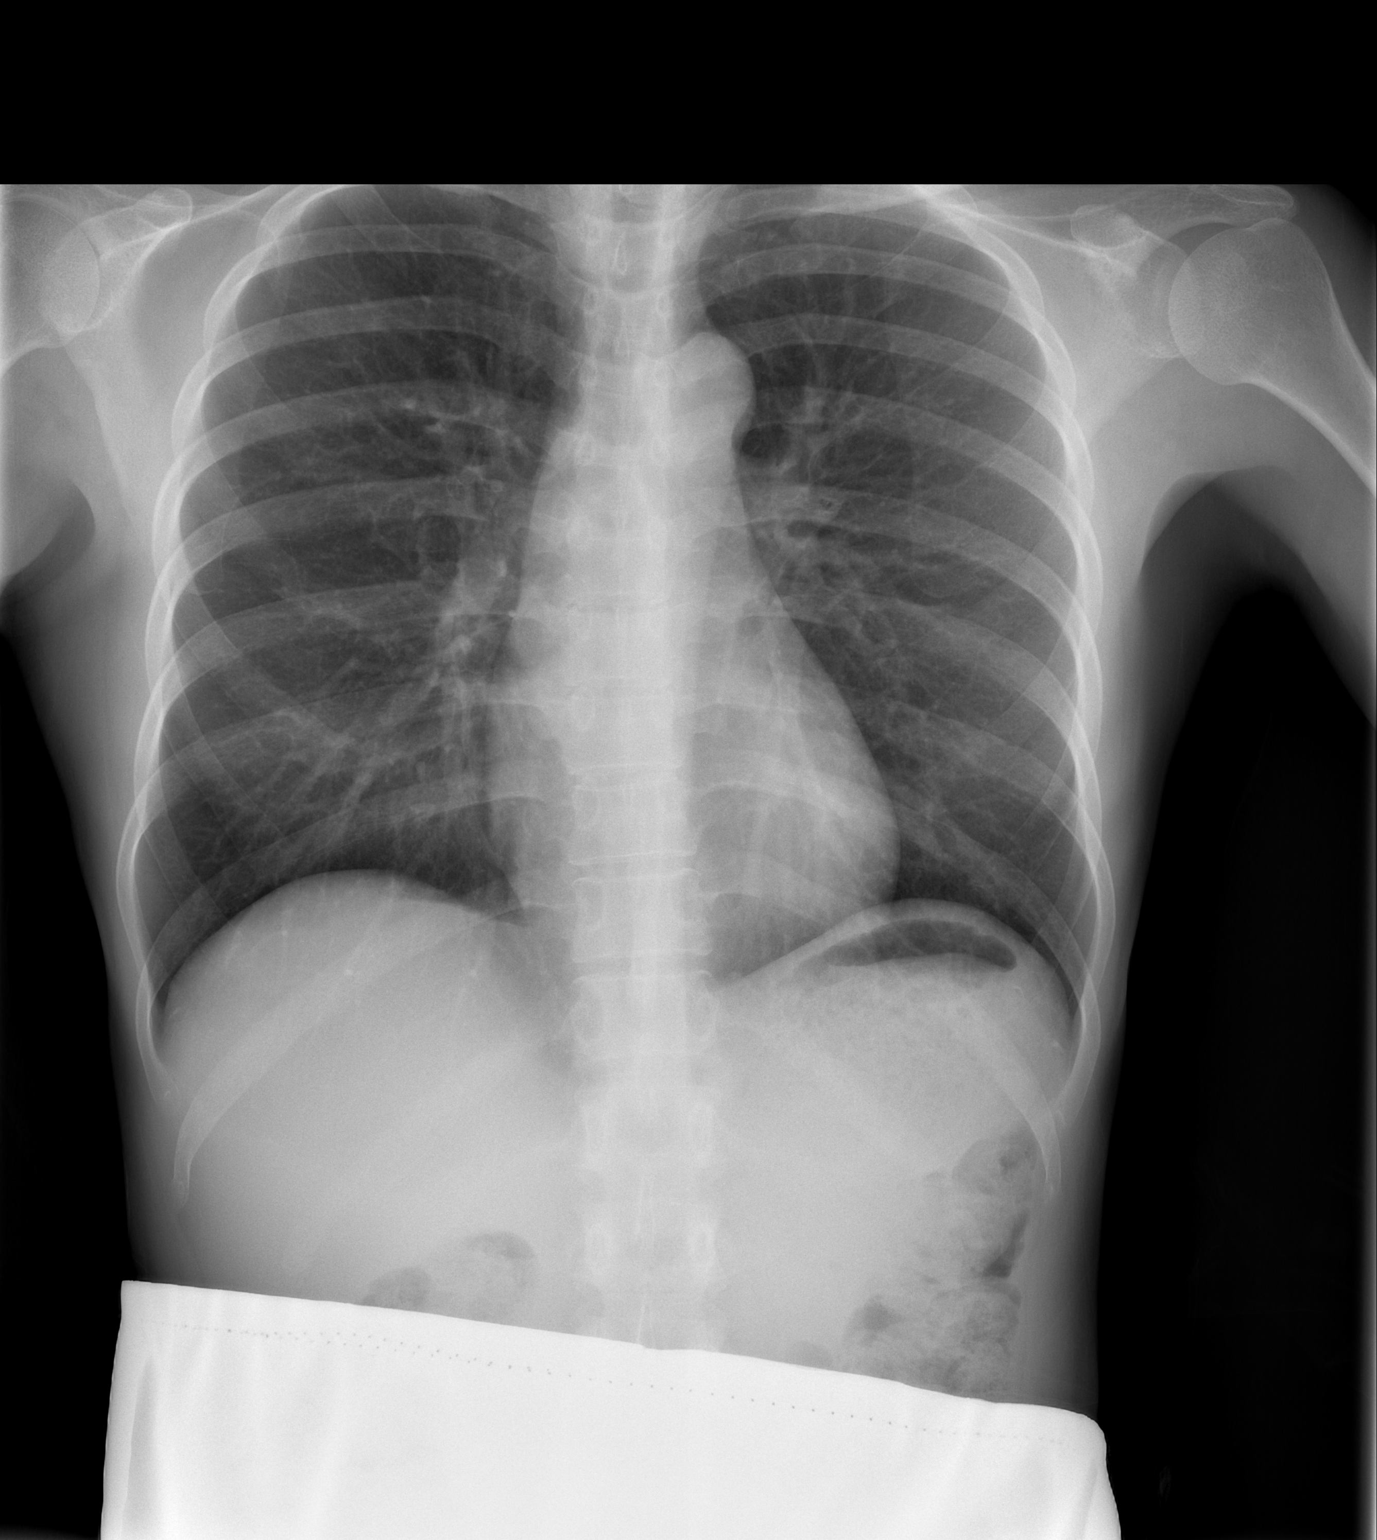

[w chest lat]
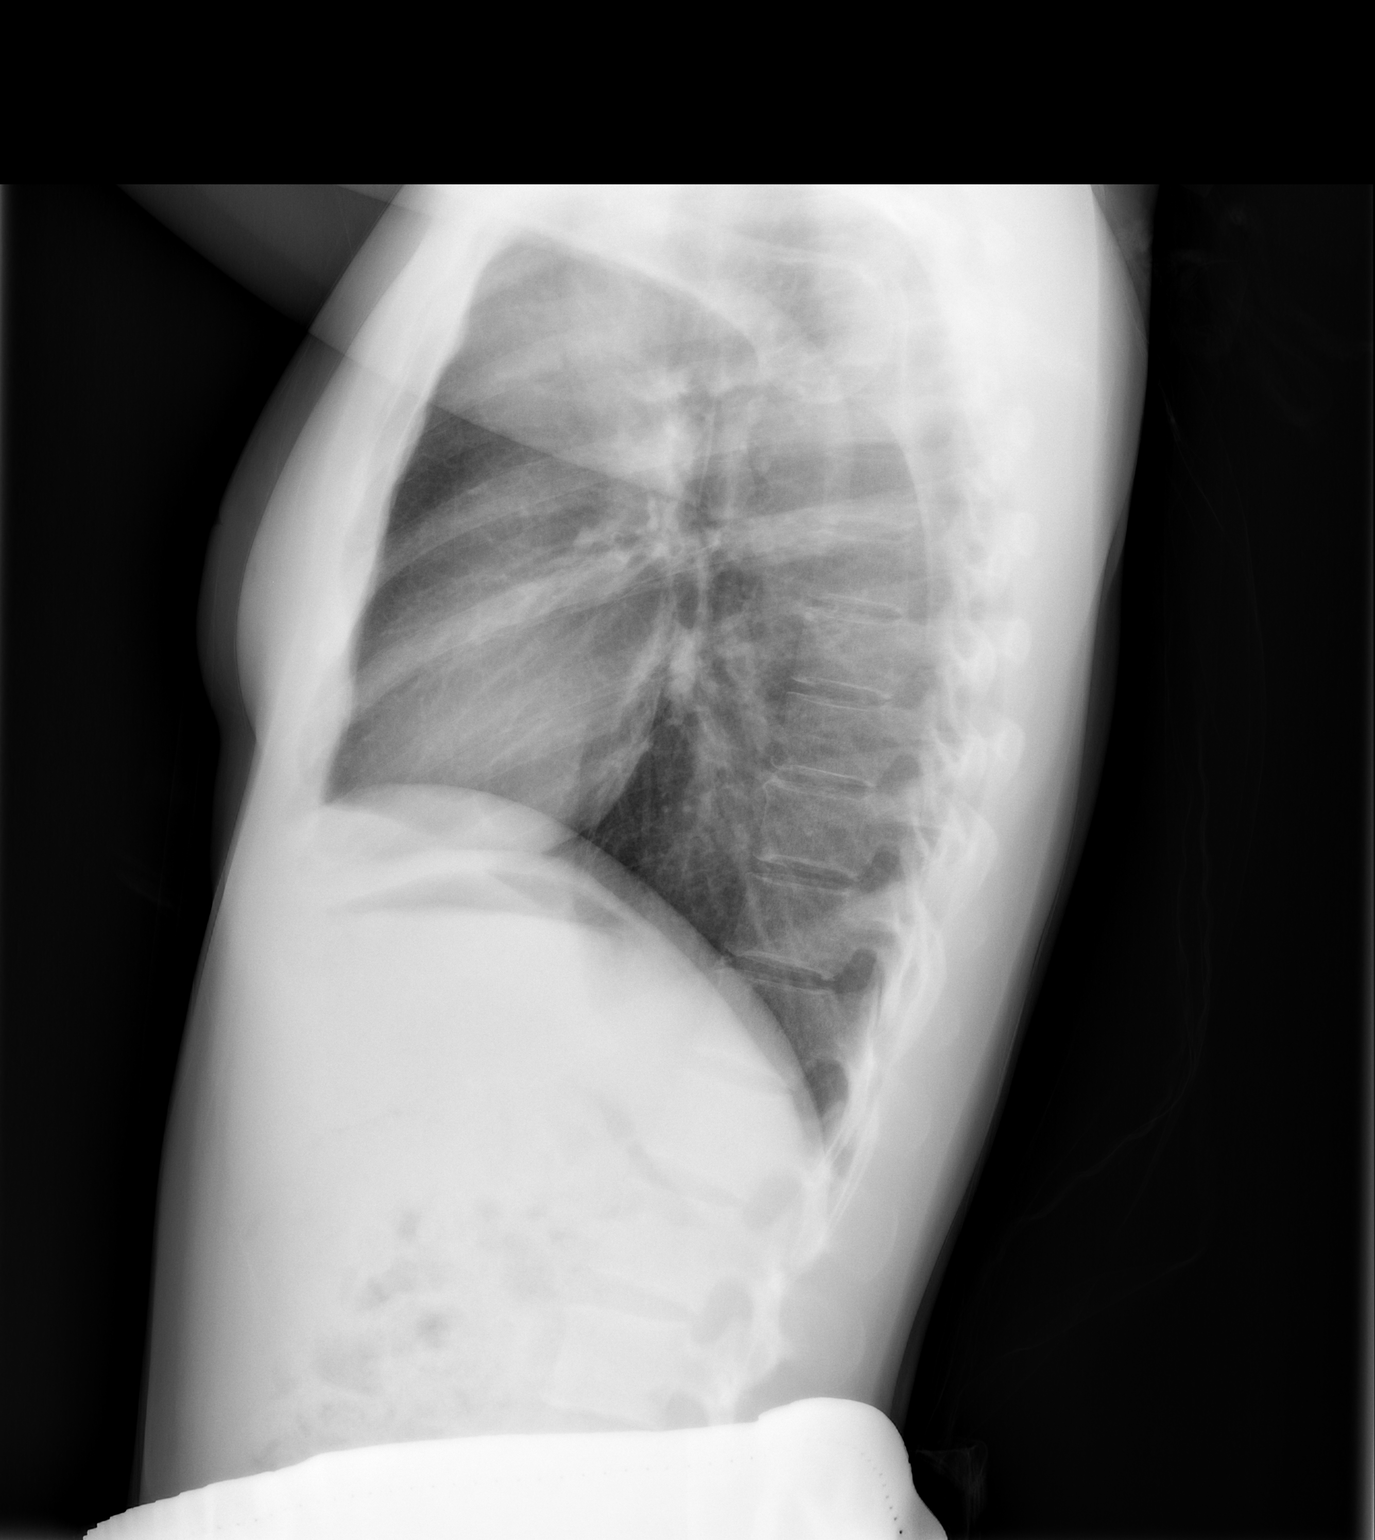

[2 of 2 positions shown; findings below may reference images not displayed]

FINDINGS: Cardiac shadow is within normal limits. Lungs are well aerated
bilaterally. No focal infiltrate or sizable effusion is seen. No
bony abnormality is noted.
IMPRESSION: No active cardiopulmonary disease. Specifically no findings to
suggest active TB are noted.
